# Patient Record
Sex: Female | Born: 1975 | Race: White | Hispanic: No | Marital: Married | Smoking: Never smoker
Health system: Southern US, Community
[De-identification: ages and names within clinical notes are randomized; demographics above are authoritative.]

## PROBLEM LIST (undated history)

## (undated) DIAGNOSIS — R61 Generalized hyperhidrosis: Secondary | ICD-10-CM

## (undated) HISTORY — DX: Generalized hyperhidrosis: R61

## (undated) HISTORY — PX: ADENOIDECTOMY: SHX5191

## (undated) HISTORY — PX: TONSILLECTOMY: SUR1361

---

## 1999-05-05 ENCOUNTER — Other Ambulatory Visit: Admission: RE | Admit: 1999-05-05 | Discharge: 1999-05-05 | Payer: Self-pay | Admitting: *Deleted

## 2000-05-31 ENCOUNTER — Other Ambulatory Visit: Admission: RE | Admit: 2000-05-31 | Discharge: 2000-05-31 | Payer: Self-pay | Admitting: Obstetrics and Gynecology

## 2001-06-09 ENCOUNTER — Other Ambulatory Visit: Admission: RE | Admit: 2001-06-09 | Discharge: 2001-06-09 | Payer: Self-pay | Admitting: Obstetrics and Gynecology

## 2002-06-12 ENCOUNTER — Other Ambulatory Visit: Admission: RE | Admit: 2002-06-12 | Discharge: 2002-06-12 | Payer: Self-pay | Admitting: Obstetrics and Gynecology

## 2002-06-14 ENCOUNTER — Encounter: Admission: RE | Admit: 2002-06-14 | Discharge: 2002-06-14 | Payer: Self-pay | Admitting: Obstetrics and Gynecology

## 2002-06-14 ENCOUNTER — Encounter: Payer: Self-pay | Admitting: Obstetrics and Gynecology

## 2003-06-18 ENCOUNTER — Other Ambulatory Visit: Admission: RE | Admit: 2003-06-18 | Discharge: 2003-06-18 | Payer: Self-pay | Admitting: Obstetrics and Gynecology

## 2004-06-12 ENCOUNTER — Other Ambulatory Visit: Admission: RE | Admit: 2004-06-12 | Discharge: 2004-06-12 | Payer: Self-pay | Admitting: Internal Medicine

## 2005-06-10 ENCOUNTER — Other Ambulatory Visit: Admission: RE | Admit: 2005-06-10 | Discharge: 2005-06-10 | Payer: Self-pay | Admitting: Obstetrics and Gynecology

## 2008-06-11 ENCOUNTER — Inpatient Hospital Stay (HOSPITAL_COMMUNITY): Admission: AD | Admit: 2008-06-11 | Discharge: 2008-06-13 | Payer: Self-pay | Admitting: Obstetrics and Gynecology

## 2008-07-09 ENCOUNTER — Ambulatory Visit: Admission: RE | Admit: 2008-07-09 | Discharge: 2008-07-09 | Payer: Self-pay | Admitting: Obstetrics and Gynecology

## 2009-10-18 ENCOUNTER — Inpatient Hospital Stay (HOSPITAL_COMMUNITY): Admission: AD | Admit: 2009-10-18 | Discharge: 2009-10-18 | Payer: Self-pay | Admitting: Obstetrics and Gynecology

## 2010-05-06 LAB — CBC
HCT: 31.1 % — ABNORMAL LOW (ref 36.0–46.0)
Hemoglobin: 14 g/dL (ref 12.0–15.0)
MCHC: 35.9 g/dL (ref 30.0–36.0)
MCV: 96.4 fL (ref 78.0–100.0)
MCV: 97.1 fL (ref 78.0–100.0)
Platelets: 158 10*3/uL (ref 150–400)
RBC: 4.05 MIL/uL (ref 3.87–5.11)
WBC: 16.3 10*3/uL — ABNORMAL HIGH (ref 4.0–10.5)

## 2010-05-23 ENCOUNTER — Inpatient Hospital Stay (HOSPITAL_COMMUNITY)
Admission: AD | Admit: 2010-05-23 | Discharge: 2010-05-24 | DRG: 372 | Disposition: A | Discharge status: Home / Self Care | Payer: BC Managed Care – PPO | Source: Ambulatory Visit | Attending: Obstetrics and Gynecology | Admitting: Obstetrics and Gynecology

## 2010-05-23 ENCOUNTER — Other Ambulatory Visit: Payer: Self-pay | Admitting: Obstetrics and Gynecology

## 2010-05-23 LAB — CBC
HCT: 39.2 % (ref 36.0–46.0)
MCH: 32.4 pg (ref 26.0–34.0)
MCHC: 33.7 g/dL (ref 30.0–36.0)
RDW: 12.7 % (ref 11.5–15.5)

## 2010-05-23 LAB — RPR: RPR Ser Ql: NONREACTIVE

## 2010-05-24 LAB — CBC
Hemoglobin: 9 g/dL — ABNORMAL LOW (ref 12.0–15.0)
MCH: 31.7 pg (ref 26.0–34.0)
MCHC: 33 g/dL (ref 30.0–36.0)
RDW: 13 % (ref 11.5–15.5)

## 2010-05-30 NOTE — Op Note (Signed)
  Angel Huffman, Angel Huffman              ACCOUNT NO.:  [id number]  MEDICAL RECORD NO.:  [id number]           PATIENT TYPE:  I  LOCATION:  9175                          FACILITY:  [facility]  PHYSICIAN:  Malachi Pro. Ambrose Mantle, M.D. DATE OF BIRTH:  1975-11-19  DATE OF PROCEDURE:  05/23/2010 DATE OF DISCHARGE:                              OPERATIVE REPORT   PREOPERATIVE DIAGNOSIS:  Retained placenta.  POSTOPERATIVE DIAGNOSIS:  Retained placenta with breakdown of second- degree midline laceration tear and resuture of that.  OPERATION:  Manual removal of the placenta and repair of second-degree midline laceration.  OPERATOR:  Malachi Pro. Ambrose Mantle, MD  ANESTHESIA:  General anesthesia.  The patient had delivered at 6:34 a.m. and after approximately 1 hour Dr. Malen Gauze gave her several sublingual doses of nitroglycerin that did not cause any relaxation of the cervix.  There was no part of the placenta then I could feel outside the cervix and even trying to go inside the cervix I could not feel the placenta.  She was taken to the operating room and placed under satisfactory general anesthesia and intubated, then placed in lithotomy position.  The vulva, vagina, and perineum were prepped with Betadine solution and a [place] catheter was used to catheterize the bladder and empty it after the urethra was prepped.  The area was draped as a sterile field and I examined the patient and again there was no part of the placenta that was outside the cervix.  However, I could place my hand through the cervical canal into the uterus.  It was apparent that the placenta had not separated at all, so it took me several minutes of manipulation to find a plane that I could get behind the placenta, but ultimately I was able to get behind the placenta and removed the placenta intact except for a couple of initial pieces that I had removed earlier.  I then inspected the uterus very carefully to see if I thought that all the  placenta had been removed.  It should felt like everything was out that did seem to be marked indentation in the central part of the fundus suggesting maybe a partially septate uterus.  I did not feel any remaining products of conception.  It was apparent though that the midline laceration I had repaired in the delivery room waiting for the placenta had torn down, so I reapproximated it with another 3-0 Vicryl suture.  The patient seemed to tolerate the procedure well.  At the end of the procedure, there were no vaginal lacerations.  There was no bleeding coming from the cervix and the procedure was terminated.  Blood loss thought to be about 200 mL during the operation.     Malachi Pro. Ambrose Mantle, M.D.    TFH/MEDQ  D:  05/23/2010  T:  05/23/2010  Job:  [id number]  Electronically Signed by Tracey Harries M.D. on 05/30/2010 09:14:27 AM

## 2010-05-31 ENCOUNTER — Inpatient Hospital Stay (HOSPITAL_COMMUNITY): Admission: AD | Admit: 2010-05-31 | Payer: Self-pay | Admitting: Obstetrics and Gynecology

## 2010-06-03 NOTE — Discharge Summary (Signed)
Angel Huffman, Angel Huffman              ACCOUNT NO.:  [id number]  MEDICAL RECORD NO.:  [id number]           PATIENT TYPE:  I  LOCATION:  9114                          [facility]ACILITY:  [facility]  PHYSICIAN:  Sherron Monday, MD        DATE OF BIRTH:  Jun 21, 1975  DATE OF ADMISSION:  05/23/2010 DATE OF DISCHARGE:  05/24/2010                              DISCHARGE SUMMARY   ADMITTING DIAGNOSIS:  Intrauterine pregnancy at term and active labor.  DISCHARGE DIAGNOSIS:  Intrauterine pregnancy at term and active labor, status post spontaneous vaginal delivery, manual extraction of placenta.  HISTORY OF PRESENT ILLNESS:  This is a 35 year old G2, P1-0-0-1 with an EDC on May 31, 2010, admitted in the second stage of labor.  She started contracting at 11:30 on the night of 26th after having spontaneous rupture of membranes.  She came to Labor and Delivery, and __________ arrival delivered a living female infant, 8 pounds 6 ounces with Apgars of 8 and 9 and a routine placenta which was extracted in the OR without complications.  PAST MEDICAL HISTORY:  Not significant.  PAST SURGICAL HISTORY:  Significant for tonsillectomy.  PAST OB/GYN HISTORY:  G1 with a term, vaginal delivery, 8 pounds 2 ounces female infant, vacuum-assisted.  She has no history of any abnormal Pap smear or any sexually transmitted diseases.  MEDICATIONS:  Prenatal vitamins and folic acid.  ALLERGIES:  To CODEINE which causes vomiting.  PENICILLIN which causes rash or vomiting.  ERYTHROMYCIN which causes rash and vomiting.  SOCIAL HISTORY:  She denies alcohol, tobacco or drug use.  FAMILY HISTORY:  Significant for breast cancer in her mother.  Skin cancer in her mother.  Diabetes in paternal grandmother and maternal grandmother with high blood pressure.  Father with kidney disease.  PHYSICAL EXAMINATION:  VITAL SIGNS:  On admission, she was afebrile. Vital signs stable. GENERAL:  No apparent distress. CARDIOVASCULAR:  Regular rate and  rhythm. LUNGS:  Clear to auscultation bilaterally. ABDOMEN:  Soft and fundus nontender. EXTREMITIES:  Symmetric and nontender.  PRENATAL LABS:  A positive.  Antibody screen negative.  Pap smear within normal limits.  Rubella immune.  RPR nonreactive.  Urine culture was negative.  Hepatitis B surface antigen was negative.  HIV was negative,. Gonorrhea and Chlamydia negative.  Cystic fibrosis screen declined. First trimester screen with a 1:140 risk of trisomy 52, 1:590 risk of trisomy 57.  AFP within normal limits.  Glucola 136, 3-hour GTT within normal limits.  Group B strep was negative.  Pregnancy date by LMP consistent with first trimester ultrasound.  The patient was offered amnio, but declined with increased risk of trisomy 52.  Prenatal care was otherwise uncomplicated.  ASSESSMENT AND PLAN:  She was admitted and for delivery was sent to the OR for manual removal of the placenta.  Her postpartum course was relatively uncomplicated.  She remained afebrile.  Vital signs stable throughout.  Her hemoglobin decreased from 13.2 to 9.0.  She received antibiotics for 24 hours per the manual instruction.  She requested discharge home on postpartum day #1.  She had been afebrile.  She is ambulating, voiding and  tolerating a diet.  She was discharged home with Motrin, Vicodin, prenatal vitamins, and iron as well as instructions and numbers to call if any questions or problems as well as instructions on likely symptoms from her retained placenta.  She voiced understanding and wished to proceed.     Sherron Monday, MD     JB/MEDQ  D:  05/24/2010  T:  05/25/2010  Job:  [id number]  Electronically Signed by Sherron Monday MD on 06/03/2010 08:49:50 AM

## 2011-02-10 DIAGNOSIS — M4310 Spondylolisthesis, site unspecified: Secondary | ICD-10-CM | POA: Insufficient documentation

## 2011-11-26 LAB — OB RESULTS CONSOLE GC/CHLAMYDIA: Chlamydia: NEGATIVE

## 2011-11-26 LAB — OB RESULTS CONSOLE RUBELLA ANTIBODY, IGM: Rubella: IMMUNE

## 2011-11-26 LAB — OB RESULTS CONSOLE RPR: RPR: NONREACTIVE

## 2011-11-26 LAB — OB RESULTS CONSOLE ANTIBODY SCREEN: Antibody Screen: NEGATIVE

## 2011-11-26 LAB — OB RESULTS CONSOLE HEPATITIS B SURFACE ANTIGEN: Hepatitis B Surface Ag: NEGATIVE

## 2012-04-12 LAB — OB RESULTS CONSOLE ABO/RH: RH Type: POSITIVE

## 2012-05-12 ENCOUNTER — Other Ambulatory Visit (HOSPITAL_COMMUNITY): Payer: Self-pay | Admitting: Obstetrics and Gynecology

## 2012-05-12 DIAGNOSIS — IMO0002 Reserved for concepts with insufficient information to code with codable children: Secondary | ICD-10-CM

## 2012-05-13 ENCOUNTER — Ambulatory Visit (HOSPITAL_COMMUNITY)
Admission: RE | Admit: 2012-05-13 | Discharge: 2012-05-13 | Disposition: A | Discharge status: Home / Self Care | Payer: BC Managed Care – PPO | Source: Ambulatory Visit | Attending: Obstetrics and Gynecology | Admitting: Obstetrics and Gynecology

## 2012-05-13 DIAGNOSIS — IMO0002 Reserved for concepts with insufficient information to code with codable children: Secondary | ICD-10-CM

## 2012-05-13 DIAGNOSIS — Z3689 Encounter for other specified antenatal screening: Secondary | ICD-10-CM | POA: Insufficient documentation

## 2012-05-13 DIAGNOSIS — O365990 Maternal care for other known or suspected poor fetal growth, unspecified trimester, not applicable or unspecified: Secondary | ICD-10-CM | POA: Insufficient documentation

## 2012-05-22 IMAGING — US US OB TRANSVAGINAL
1 series · 10 of 10 positions shown · non-contrast
Comparison: None.

CLINICAL DATA: 8 weeks gestation with vaginal bleeding.

TRANSVAGINAL OB ULTRASOUND
TECHNIQUE: Transvaginal ultrasound was performed for evaluation of
the gestation as well as the maternal uterus and adnexal regions.

[Series 1: us ob transvaginal · 10 of 10 slices shown]
[im 1/10]
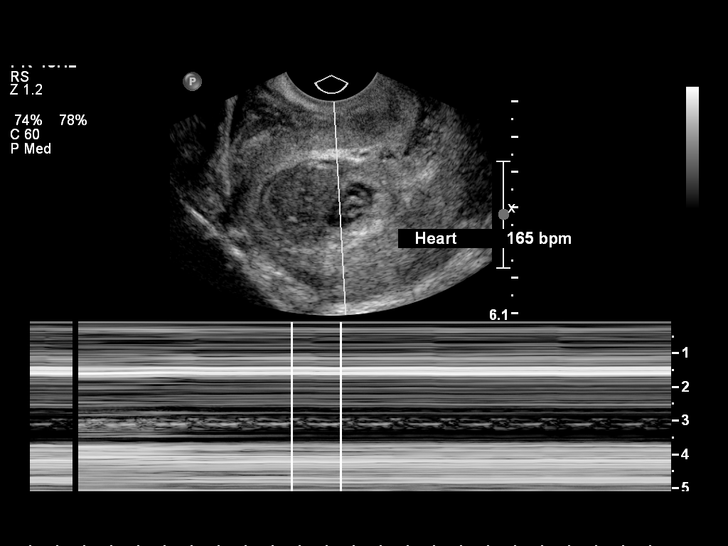
[im 2/10]
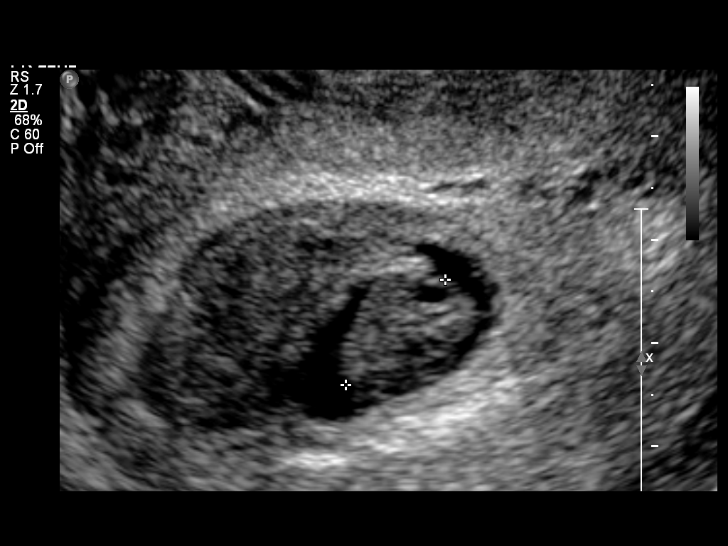
[im 3/10]
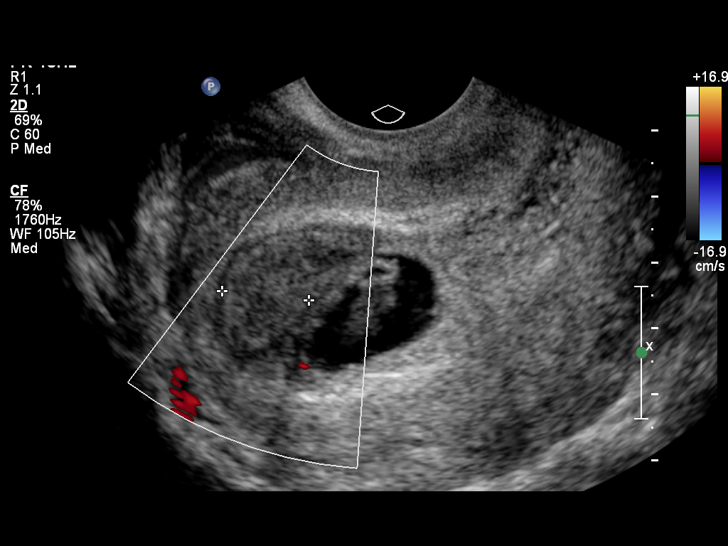
[im 4/10]
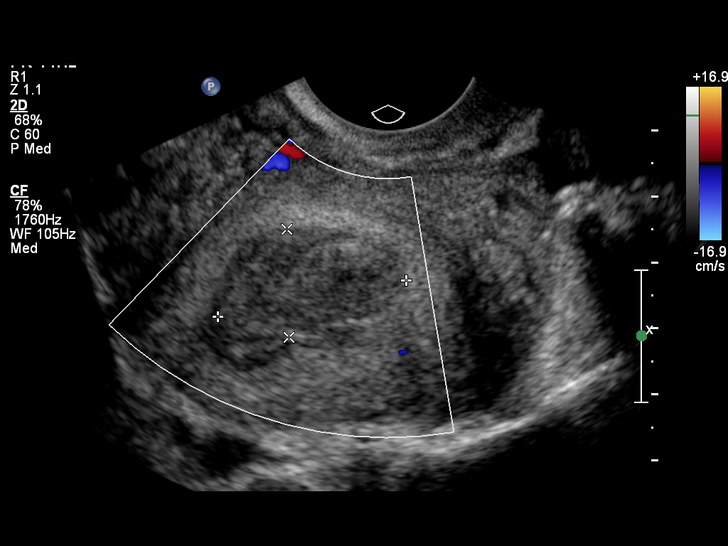
[im 5/10]
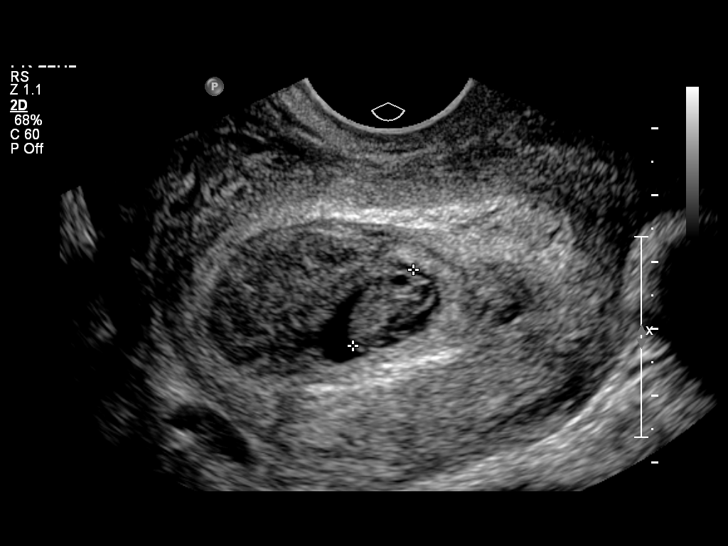
[im 6/10]
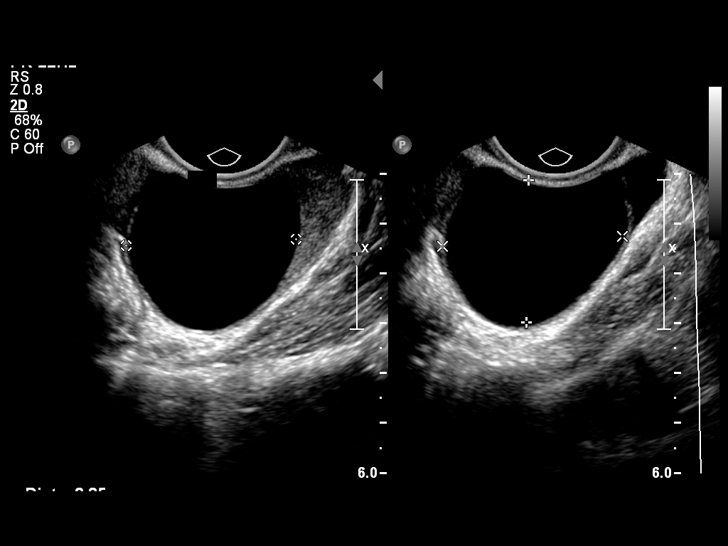
[im 7/10]
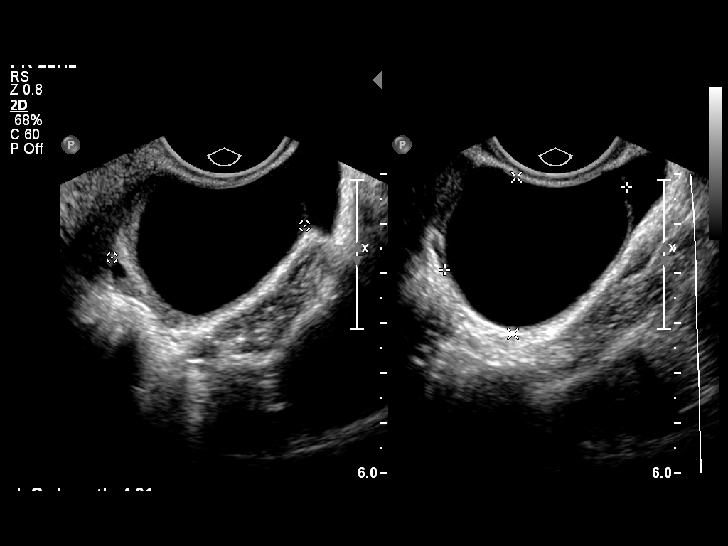
[im 8/10]
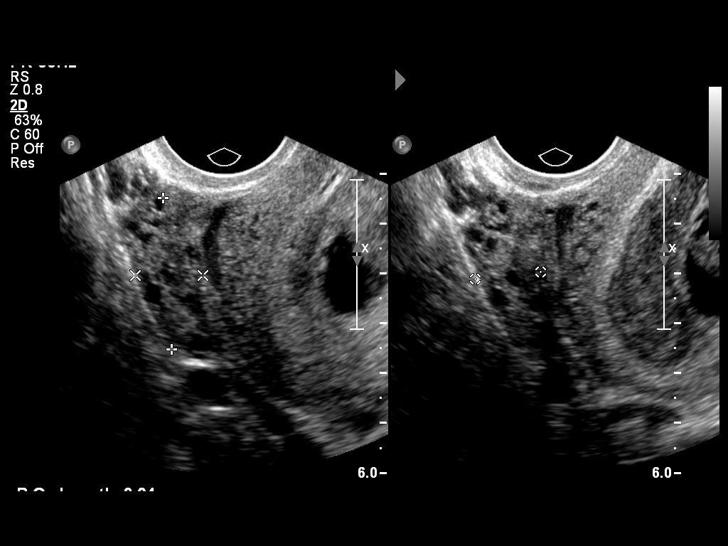
[im 9/10]
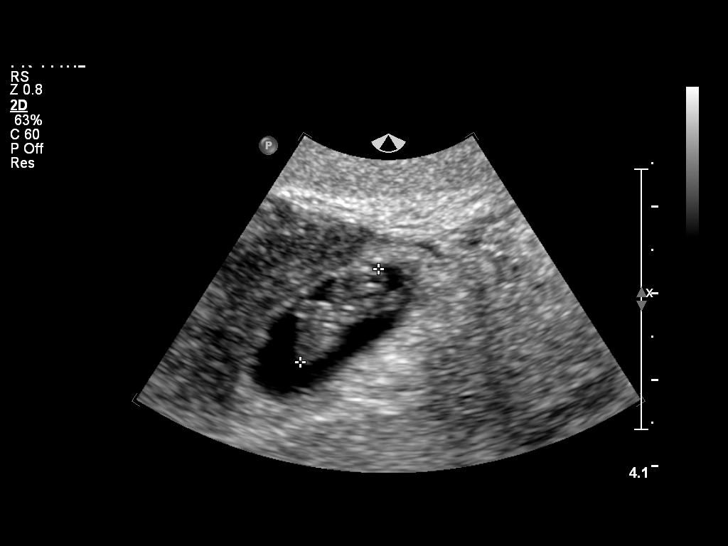
[im 10/10]
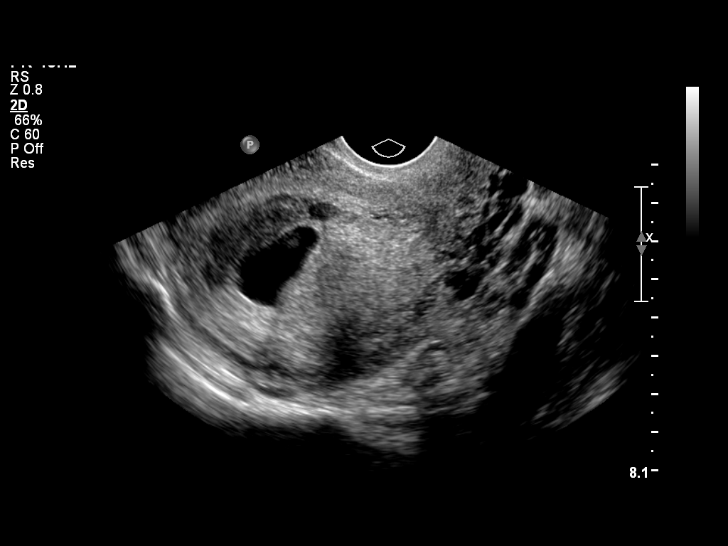

[10 of 10 positions shown; findings below may reference images not displayed]

FINDINGS: Transvaginal imaging was ordered and performed.  No
transabdominal imaging was obtained as part of this exam.

A single intrauterine gestational sac is identified with the
visualized yolk sac and embryo.  Embryonic cardiac activity is
visible within the measured heart rate of 165 beats per minute.
There is a small to moderate subchorionic hemorrhage.  Crown-rump
length is 1.4 cm, corresponding to an estimated 7-week-8-day
gestational age.

The right ovary is sonographically normal.  3.6 cm simple cyst is
identified in the left ovary with a small amount of adjacent free
fluid.
IMPRESSION: Single living intrauterine gestation at estimated 7-week-8-day
gestational age.

Small to moderate subchorionic hemorrhage.

3.6 cm simple left adnexal cyst.  Follow up ultrasound in 6 weeks
is recommended to assess resolution.

## 2012-05-31 LAB — OB RESULTS CONSOLE GBS: GBS: NEGATIVE

## 2012-07-04 ENCOUNTER — Telehealth (HOSPITAL_COMMUNITY): Payer: Self-pay | Admitting: *Deleted

## 2012-07-04 ENCOUNTER — Encounter (HOSPITAL_COMMUNITY): Payer: Self-pay | Admitting: *Deleted

## 2012-07-04 NOTE — Telephone Encounter (Signed)
Preadmission screen  

## 2012-07-05 ENCOUNTER — Inpatient Hospital Stay (HOSPITAL_COMMUNITY)
Admission: AD | Admit: 2012-07-05 | Discharge: 2012-07-06 | DRG: 373 | Disposition: A | Discharge status: Home / Self Care | Payer: BC Managed Care – PPO | Source: Ambulatory Visit | Attending: Obstetrics and Gynecology | Admitting: Obstetrics and Gynecology

## 2012-07-05 ENCOUNTER — Encounter (HOSPITAL_COMMUNITY): Payer: Self-pay | Admitting: *Deleted

## 2012-07-05 DIAGNOSIS — O09529 Supervision of elderly multigravida, unspecified trimester: Secondary | ICD-10-CM | POA: Diagnosis present

## 2012-07-05 LAB — CBC
MCH: 33.2 pg (ref 26.0–34.0)
MCHC: 34.6 g/dL (ref 30.0–36.0)
Platelets: 145 10*3/uL — ABNORMAL LOW (ref 150–400)
RBC: 3.86 MIL/uL — ABNORMAL LOW (ref 3.87–5.11)

## 2012-07-05 LAB — RPR: RPR Ser Ql: NONREACTIVE

## 2012-07-05 MED ORDER — DIPHENHYDRAMINE HCL 25 MG PO CAPS: 25.0000 mg | ORAL_CAPSULE | Freq: Four times a day (QID) | ORAL | Status: DC | PRN

## 2012-07-05 MED ORDER — COMPLETENATE 29-1 MG PO CHEW
1.0000 | CHEWABLE_TABLET | Freq: Every day | ORAL | Status: DC
  Administered 2012-07-05: 1 via ORAL
  Filled 2012-07-05 (×2): qty 1

## 2012-07-05 MED ORDER — FLEET ENEMA 7-19 GM/118ML RE ENEM: 1.0000 | ENEMA | RECTAL | Status: DC | PRN

## 2012-07-05 MED ORDER — LIDOCAINE HCL (PF) 1 % IJ SOLN
30.0000 mL | INTRAMUSCULAR | Status: DC | PRN
  Administered 2012-07-05: 30 mL via SUBCUTANEOUS
  Filled 2012-07-05: qty 30

## 2012-07-05 MED ORDER — SIMETHICONE 80 MG PO CHEW: 80.0000 mg | CHEWABLE_TABLET | ORAL | Status: DC | PRN

## 2012-07-05 MED ORDER — LANOLIN HYDROUS EX OINT: TOPICAL_OINTMENT | CUTANEOUS | Status: DC | PRN

## 2012-07-05 MED ORDER — ACETAMINOPHEN 325 MG PO TABS: 650.0000 mg | ORAL_TABLET | ORAL | Status: DC | PRN

## 2012-07-05 MED ORDER — IBUPROFEN 100 MG/5ML PO SUSP
600.0000 mg | Freq: Four times a day (QID) | ORAL | Status: DC
  Administered 2012-07-05 – 2012-07-06 (×4): 600 mg via ORAL
  Filled 2012-07-05 (×5): qty 30

## 2012-07-05 MED ORDER — LIDOCAINE HCL (PF) 1 % IJ SOLN
INTRAMUSCULAR | Status: AC
  Administered 2012-07-05: 30 mL via SUBCUTANEOUS
  Filled 2012-07-05: qty 30

## 2012-07-05 MED ORDER — LACTATED RINGERS IV SOLN
INTRAVENOUS | Status: DC
  Administered 2012-07-05: 09:00:00 via INTRAVENOUS

## 2012-07-05 MED ORDER — BENZOCAINE-MENTHOL 20-0.5 % EX AERO
1.0000 "application " | INHALATION_SPRAY | CUTANEOUS | Status: DC | PRN
  Administered 2012-07-05: 1 via TOPICAL
  Filled 2012-07-05: qty 56

## 2012-07-05 MED ORDER — WITCH HAZEL-GLYCERIN EX PADS: 1.0000 "application " | MEDICATED_PAD | CUTANEOUS | Status: DC | PRN

## 2012-07-05 MED ORDER — ONDANSETRON HCL 4 MG PO TABS: 4.0000 mg | ORAL_TABLET | ORAL | Status: DC | PRN

## 2012-07-05 MED ORDER — IBUPROFEN 600 MG PO TABS: 600.0000 mg | ORAL_TABLET | Freq: Four times a day (QID) | ORAL | Status: DC

## 2012-07-05 MED ORDER — ZOLPIDEM TARTRATE 5 MG PO TABS: 5.0000 mg | ORAL_TABLET | Freq: Every evening | ORAL | Status: DC | PRN

## 2012-07-05 MED ORDER — OXYTOCIN 40 UNITS IN LACTATED RINGERS INFUSION - SIMPLE MED
INTRAVENOUS | Status: AC
  Filled 2012-07-05: qty 1000

## 2012-07-05 MED ORDER — OXYTOCIN BOLUS FROM INFUSION
500.0000 mL | INTRAVENOUS | Status: DC
  Administered 2012-07-05: 500 mL via INTRAVENOUS

## 2012-07-05 MED ORDER — OXYTOCIN 40 UNITS IN LACTATED RINGERS INFUSION - SIMPLE MED
62.5000 mL/h | INTRAVENOUS | Status: DC
  Administered 2012-07-05: 62.5 mL/h via INTRAVENOUS

## 2012-07-05 MED ORDER — SENNOSIDES-DOCUSATE SODIUM 8.6-50 MG PO TABS
2.0000 | ORAL_TABLET | Freq: Every day | ORAL | Status: DC
  Administered 2012-07-05: 2 via ORAL

## 2012-07-05 MED ORDER — ONDANSETRON HCL 4 MG/2ML IJ SOLN: 4.0000 mg | INTRAMUSCULAR | Status: DC | PRN

## 2012-07-05 MED ORDER — LACTATED RINGERS IV SOLN: 500.0000 mL | INTRAVENOUS | Status: DC | PRN

## 2012-07-05 MED ORDER — TETANUS-DIPHTH-ACELL PERTUSSIS 5-2.5-18.5 LF-MCG/0.5 IM SUSP: 0.5000 mL | Freq: Once | INTRAMUSCULAR | Status: DC

## 2012-07-05 MED ORDER — CITRIC ACID-SODIUM CITRATE 334-500 MG/5ML PO SOLN: 30.0000 mL | ORAL | Status: DC | PRN

## 2012-07-05 MED ORDER — PRENATAL MULTIVITAMIN CH: 1.0000 | ORAL_TABLET | Freq: Every day | ORAL | Status: DC

## 2012-07-05 MED ORDER — DIBUCAINE 1 % RE OINT: 1.0000 "application " | TOPICAL_OINTMENT | RECTAL | Status: DC | PRN

## 2012-07-05 MED ORDER — IBUPROFEN 600 MG PO TABS: 600.0000 mg | ORAL_TABLET | Freq: Four times a day (QID) | ORAL | Status: DC | PRN

## 2012-07-05 MED ORDER — MEASLES, MUMPS & RUBELLA VAC ~~LOC~~ INJ
0.5000 mL | INJECTION | Freq: Once | SUBCUTANEOUS | Status: DC
  Filled 2012-07-05: qty 0.5

## 2012-07-05 MED ORDER — ONDANSETRON HCL 4 MG/2ML IJ SOLN: 4.0000 mg | Freq: Four times a day (QID) | INTRAMUSCULAR | Status: DC | PRN

## 2012-07-05 NOTE — MAU Note (Signed)
Pt states her uc's became intense during the night, has had some spotting, denies LOF.  Was 2 cm's in MD office yesterday, hx of rapid labor.

## 2012-07-05 NOTE — Progress Notes (Signed)
Patient ID: Angel Huffman, female   DOB: Apr 22, 1975, 37 y.o.   MRN: [id number] Delivery note: Pt progressed to full dilatation and pushed well to deliver a living female infant ROA over a second degree ML laceration. There were 2 loops of nuchal cord that were loose. Apgars were 9 and 9 at 1 and 5 minutes. The placenta was intact and the uterus was normal. The laceration was repaired with 3-0 vicryl under local block with 20 cc's 1% xylocaine. EBL 400 cc's

## 2012-07-05 NOTE — H&P (Signed)
Angel Huffman, RYE NO.:  [id number]  MEDICAL RECORD NO.:  [id number]  LOCATION:  9175                          FACILITY:  [facility]  PHYSICIAN:  Malachi Pro. Ambrose Mantle, M.D. DATE OF BIRTH:  1975-03-21  DATE OF ADMISSION:  07/05/2012 DATE OF DISCHARGE:                             HISTORY & PHYSICAL   PRESENT ILLNESS:  This is a 37 year old white female, para 2-0-0-2, gravida 3, EDC June 30, 2012 by her last period admitted in labor.  Blood group and type A positive, negative antibody.  Pap smear not recorded. Rubella immune.  RPR nonreactive.  Urine culture negative.  Hepatitis B surface antigen negative, HIV negative, GC and Chlamydia negative. Cystic fibrosis declined.  First trimester screen, Down syndrome risk 1 in 56, trisomy 18 risk 1 in 30.  Quad screen was negative.  The patient had a history of precipitous labor and retained placenta.  First trimester screen was normal.  The patient had a low fundal height and had nonstress test, but then the ultrasound showed 70th percentile for weight.  The nonstress test was discontinued on May 14th.  Her estimated fetal weight was in the 37th percentile.  AFI was 14.  The patient requested no induction before 41 weeks.  Group B strep according to the patient was negative.  PAST MEDICAL HISTORY:  Revealed allergies to CODEINE, ERYTHROMYCIN, PENICILLIN, but no latex allergy.  No significant medical history.  OBSTETRIC HISTORY:  She delivered in 2010 an 8 pound 2 ounce female. Birth in April of 2012 an 8 pound female infant vaginally.  PHYSICAL EXAMINATION:  VITAL SIGNS:  On admission, blood pressure 113/58, temperature 97.6, pulse 55, respirations 22. HEART:  Normal size and sounds.  No murmurs. LUNGS:  Clear to auscultation. ABDOMEN:  Soft. PELVIC:  Cervix 9 cm, 100% vertex at a -1 to 0.  Artificial rupture of the membranes produced clear fluid.  ADMITTING IMPRESSION:  Intrauterine pregnancy at 40+ weeks, active labor.   The patient is admitted.  Declines the opportunity for an epidural.     Malachi Pro. Ambrose Mantle, M.D.     TFH/MEDQ  D:  07/05/2012  T:  07/05/2012  Job:  [id number]

## 2012-07-06 LAB — CBC
Hemoglobin: 11.9 g/dL — ABNORMAL LOW (ref 12.0–15.0)
MCH: 32.4 pg (ref 26.0–34.0)
MCHC: 33.6 g/dL (ref 30.0–36.0)

## 2012-07-06 NOTE — Progress Notes (Signed)
Patient ID: Angel Huffman, female   DOB: 05/17/1975, 37 y.o.   MRN: [id number] #1 afebrile BP normal HGB stable wants d/c

## 2012-07-06 NOTE — Progress Notes (Signed)
Bands matched on mom and baby [phone]

## 2012-07-07 NOTE — Discharge Summary (Signed)
NAMEMACK, THURMON NO.:  [id number]  MEDICAL RECORD NO.:  [id number]  LOCATION:  9136                          [facility]ACILITY:  [facility]  PHYSICIAN:  Malachi Pro. Ambrose Mantle, M.D. DATE OF BIRTH:  16-Jul-1975  DATE OF ADMISSION:  07/05/2012 DATE OF DISCHARGE:  07/06/2012                              DISCHARGE SUMMARY   HISTORY OF PRESENT ILLNESS:  A 37 year old white female, para 2-0-0-2, gravida 3 with [facility] June 30, 2012, admitted in labor.  Blood group and type A positive, negative antibody.  Pap smear not recorded, rubella immune.  RPR nonreactive.  Urine culture negative.  Hepatitis B surface antigen negative, HIV negative, GC and Chlamydia negative.  Cystic fibrosis declined.  First trimester screen, Down syndrome risk 1:640, trisomy risk 1:520.  Quad screen was negative.  The patient was admitted to the hospital.  She progressed to full dilatation and pushed well to deliver a living female infant ROA over a second-degree midline laceration.  There were 2 loops of nuchal cord that were loose.  Apgars were 9 and nine at 1 and five minutes.  The placenta was intact.  Uterus was normal.  Laceration repaired with 3-0 Vicryl under local block with 20 mL of 1% Xylocaine.  Estimated blood loss about 400 mL.  Postpartum, the patient did well.  On the first postpartum day, she said she was ready for discharge.  She had no problems and discharge was granted. RPR was nonreactive.  Initial hemoglobin 12.8, hematocrit 37, white count 9100, platelet count 145,000, followup hemoglobin was 11.9.  FINAL DIAGNOSIS:  Intrauterine pregnancy 40 plus weeks, delivered vertex.  OPERATION:  Spontaneous delivery of vertex, repair of second-degree midline laceration.  FINAL CONDITION:  Improved.  INSTRUCTIONS:  Include our regular discharge instruction booklet.  The patient is advised to read her after visit summary.  She declines analgesics and is ready for discharge.  Return in 6  weeks.     Malachi Pro. Ambrose Mantle, M.D.     TFH/MEDQ  D:  07/06/2012  T:  07/07/2012  Job:  [id number]

## 2012-07-11 ENCOUNTER — Inpatient Hospital Stay (HOSPITAL_COMMUNITY): Admission: RE | Admit: 2012-07-11 | Payer: BC Managed Care – PPO | Source: Ambulatory Visit

## 2012-10-27 ENCOUNTER — Telehealth: Payer: Self-pay | Admitting: *Deleted

## 2012-10-27 NOTE — Telephone Encounter (Signed)
Pt requested to have orthotics recovered. I informed pt that orthotics could be recovered if hard shell was in good condition at prepaid cost of about $86.00. I informed pt that orthotic must be labeled with name, date of birth and held together.

## 2012-12-01 ENCOUNTER — Other Ambulatory Visit: Payer: Self-pay

## 2013-11-27 ENCOUNTER — Encounter (HOSPITAL_COMMUNITY): Payer: Self-pay | Admitting: *Deleted

## 2014-02-27 ENCOUNTER — Other Ambulatory Visit: Payer: Self-pay | Admitting: Family Medicine

## 2014-02-27 DIAGNOSIS — M5432 Sciatica, left side: Secondary | ICD-10-CM

## 2014-03-05 ENCOUNTER — Ambulatory Visit
Admission: RE | Admit: 2014-03-05 | Discharge: 2014-03-05 | Disposition: A | Discharge status: Home / Self Care | Payer: BLUE CROSS/BLUE SHIELD | Source: Ambulatory Visit | Attending: Family Medicine | Admitting: Family Medicine

## 2014-03-05 DIAGNOSIS — M5432 Sciatica, left side: Secondary | ICD-10-CM

## 2015-08-28 ENCOUNTER — Other Ambulatory Visit: Payer: Self-pay | Admitting: Gynecology

## 2015-08-28 DIAGNOSIS — Z1231 Encounter for screening mammogram for malignant neoplasm of breast: Secondary | ICD-10-CM

## 2019-08-01 ENCOUNTER — Ambulatory Visit: Payer: 59

## 2019-08-01 ENCOUNTER — Encounter: Payer: Self-pay | Admitting: Podiatry

## 2019-08-01 ENCOUNTER — Ambulatory Visit: Payer: 59 | Admitting: Orthotics

## 2019-08-01 ENCOUNTER — Other Ambulatory Visit: Payer: Self-pay

## 2019-08-01 ENCOUNTER — Ambulatory Visit (INDEPENDENT_AMBULATORY_CARE_PROVIDER_SITE_OTHER): Payer: 59 | Admitting: Podiatry

## 2019-08-01 DIAGNOSIS — M2141 Flat foot [pes planus] (acquired), right foot: Secondary | ICD-10-CM | POA: Diagnosis not present

## 2019-08-01 DIAGNOSIS — R61 Generalized hyperhidrosis: Secondary | ICD-10-CM | POA: Insufficient documentation

## 2019-08-01 DIAGNOSIS — M2142 Flat foot [pes planus] (acquired), left foot: Secondary | ICD-10-CM | POA: Diagnosis not present

## 2019-08-01 DIAGNOSIS — M2170 Unequal limb length (acquired), unspecified site: Secondary | ICD-10-CM

## 2019-08-01 NOTE — Progress Notes (Signed)
  Subjective:  Patient ID: Angel Huffman, adult    DOB: Jun 08, 1975,  MRN: [id number] HPI Chief Complaint  Patient presents with  . Foot Orthotics    Requesting new orthotics - no pain  . New Patient (Initial Visit)    44 y.o. adult presents with the above complaint.   ROS: Denies fever chills nausea vomiting muscle aches pains calf pain back pain chest pain shortness of breath.  Past Medical History:  Diagnosis Date  . Hyperhidrosis    hands only   Past Surgical History:  Procedure Laterality Date  . ADENOIDECTOMY    . TONSILLECTOMY      Current Outpatient Medications:  .  cholecalciferol (VITAMIN D3) 25 MCG (1000 UNIT) tablet, , Disp: , Rfl:   Allergies  Allergen Reactions  . Codeine Nausea Only  . Erythromycin Base Nausea Only and Rash  . Penicillins Nausea Only and Rash   Review of Systems Objective:  There were no vitals filed for this visit.  General: Well developed, nourished, in no acute distress, alert and oriented x3   Dermatological: Skin is warm, dry and supple bilateral. Nails x 10 are well maintained; remaining integument appears unremarkable at this time. There are no open sores, no preulcerative lesions, no rash or signs of infection present.  Vascular: Dorsalis Pedis artery and Posterior Tibial artery pedal pulses are 2/4 bilateral with immedate capillary fill time. Pedal hair growth present. No varicosities and no lower extremity edema present bilateral.   Neruologic: Grossly intact via light touch bilateral. Vibratory intact via tuning fork bilateral. Protective threshold with Semmes Wienstein monofilament intact to all pedal sites bilateral. Patellar and Achilles deep tendon reflexes 2+ bilateral. No Babinski or clonus noted bilateral.   Musculoskeletal: No gross boney pedal deformities bilateral. No pain, crepitus, or limitation noted with foot and ankle range of motion bilateral. Muscular strength 5/5 in all groups tested bilateral.   Hypermobile joints distal to the ankle.  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  1.5 cm leg length discrepancy shorter on the right side  Gait: Unassisted, Nonantalgic.    Radiographs:  None taken  Assessment & Plan:   Assessment: Hypermobility pes planus posterior tibial tendinitis and 1.5 cm leg length discrepancy short on the right side  Plan: She was seen by Angel Huffman today to have the orthotics made.     Angel Huffman T. Garceno, [place]

## 2019-08-01 NOTE — Progress Notes (Signed)
Scanned for f/o to address pes planus foot type and leg length discrepency

## 2019-08-22 ENCOUNTER — Ambulatory Visit (INDEPENDENT_AMBULATORY_CARE_PROVIDER_SITE_OTHER): Payer: 59 | Admitting: Orthotics

## 2019-08-22 ENCOUNTER — Other Ambulatory Visit: Payer: Self-pay

## 2019-08-22 DIAGNOSIS — M2142 Flat foot [pes planus] (acquired), left foot: Secondary | ICD-10-CM

## 2019-08-22 DIAGNOSIS — M2170 Unequal limb length (acquired), unspecified site: Secondary | ICD-10-CM

## 2019-08-22 DIAGNOSIS — M2141 Flat foot [pes planus] (acquired), right foot: Secondary | ICD-10-CM

## 2019-08-22 NOTE — Progress Notes (Signed)
Patient came in today to pick up custom made foot orthotics.  The goals were accomplished and the patient reported no dissatisfaction with said orthotics.  Patient was advised of breakin period and how to report any issues. 

## 2019-11-29 DIAGNOSIS — Z1389 Encounter for screening for other disorder: Secondary | ICD-10-CM | POA: Diagnosis not present

## 2019-11-29 DIAGNOSIS — Z1231 Encounter for screening mammogram for malignant neoplasm of breast: Secondary | ICD-10-CM | POA: Diagnosis not present

## 2019-11-29 DIAGNOSIS — Z6822 Body mass index (BMI) 22.0-22.9, adult: Secondary | ICD-10-CM | POA: Diagnosis not present

## 2019-11-29 DIAGNOSIS — Z130 Encounter for screening for diseases of the blood and blood-forming organs and certain disorders involving the immune mechanism: Secondary | ICD-10-CM | POA: Diagnosis not present

## 2019-11-29 DIAGNOSIS — Z01419 Encounter for gynecological examination (general) (routine) without abnormal findings: Secondary | ICD-10-CM | POA: Diagnosis not present

## 2020-06-28 ENCOUNTER — Encounter (INDEPENDENT_AMBULATORY_CARE_PROVIDER_SITE_OTHER): Payer: Self-pay

## 2020-08-15 DIAGNOSIS — L74510 Primary focal hyperhidrosis, axilla: Secondary | ICD-10-CM | POA: Diagnosis not present

## 2020-08-15 DIAGNOSIS — L858 Other specified epidermal thickening: Secondary | ICD-10-CM | POA: Diagnosis not present

## 2020-08-15 DIAGNOSIS — L570 Actinic keratosis: Secondary | ICD-10-CM | POA: Diagnosis not present

## 2020-08-15 DIAGNOSIS — L814 Other melanin hyperpigmentation: Secondary | ICD-10-CM | POA: Diagnosis not present

## 2020-08-22 DIAGNOSIS — M778 Other enthesopathies, not elsewhere classified: Secondary | ICD-10-CM | POA: Diagnosis not present

## 2020-08-22 DIAGNOSIS — Z13220 Encounter for screening for lipoid disorders: Secondary | ICD-10-CM | POA: Diagnosis not present

## 2020-08-22 DIAGNOSIS — Z0000 Encounter for general adult medical examination without abnormal findings: Secondary | ICD-10-CM | POA: Diagnosis not present
